# Patient Record
Sex: Female | Born: 1940 | Race: Black or African American | Hispanic: No | Marital: Single | Smoking: Former smoker
Health system: Southern US, Community
[De-identification: ages and names within clinical notes are randomized; demographics above are authoritative.]

## PROBLEM LIST (undated history)

## (undated) DIAGNOSIS — C801 Malignant (primary) neoplasm, unspecified: Secondary | ICD-10-CM

## (undated) DIAGNOSIS — C50919 Malignant neoplasm of unspecified site of unspecified female breast: Secondary | ICD-10-CM

## (undated) HISTORY — PX: ABDOMINAL HYSTERECTOMY: SHX81

## (undated) HISTORY — PX: EYE SURGERY: SHX253

---

## 2021-03-28 ENCOUNTER — Emergency Department (HOSPITAL_COMMUNITY): Payer: Medicare Other

## 2021-03-28 ENCOUNTER — Encounter (HOSPITAL_COMMUNITY): Payer: Self-pay

## 2021-03-28 ENCOUNTER — Emergency Department (HOSPITAL_COMMUNITY)
Admission: EM | Admit: 2021-03-28 | Discharge: 2021-03-28 | Disposition: A | Discharge status: Home / Self Care | Payer: Medicare Other | Attending: Emergency Medicine | Admitting: Emergency Medicine

## 2021-03-28 ENCOUNTER — Other Ambulatory Visit: Payer: Self-pay

## 2021-03-28 DIAGNOSIS — J984 Other disorders of lung: Secondary | ICD-10-CM | POA: Insufficient documentation

## 2021-03-28 DIAGNOSIS — Z853 Personal history of malignant neoplasm of breast: Secondary | ICD-10-CM | POA: Diagnosis not present

## 2021-03-28 DIAGNOSIS — R55 Syncope and collapse: Secondary | ICD-10-CM | POA: Diagnosis not present

## 2021-03-28 DIAGNOSIS — Z87891 Personal history of nicotine dependence: Secondary | ICD-10-CM | POA: Insufficient documentation

## 2021-03-28 DIAGNOSIS — Y9301 Activity, walking, marching and hiking: Secondary | ICD-10-CM | POA: Insufficient documentation

## 2021-03-28 DIAGNOSIS — S62307A Unspecified fracture of fifth metacarpal bone, left hand, initial encounter for closed fracture: Secondary | ICD-10-CM | POA: Diagnosis not present

## 2021-03-28 DIAGNOSIS — W01198A Fall on same level from slipping, tripping and stumbling with subsequent striking against other object, initial encounter: Secondary | ICD-10-CM | POA: Insufficient documentation

## 2021-03-28 DIAGNOSIS — S0081XA Abrasion of other part of head, initial encounter: Secondary | ICD-10-CM | POA: Insufficient documentation

## 2021-03-28 DIAGNOSIS — Y9289 Other specified places as the place of occurrence of the external cause: Secondary | ICD-10-CM | POA: Insufficient documentation

## 2021-03-28 DIAGNOSIS — E876 Hypokalemia: Secondary | ICD-10-CM | POA: Diagnosis not present

## 2021-03-28 DIAGNOSIS — S60922A Unspecified superficial injury of left hand, initial encounter: Secondary | ICD-10-CM | POA: Diagnosis present

## 2021-03-28 HISTORY — DX: Malignant (primary) neoplasm, unspecified: C80.1

## 2021-03-28 HISTORY — DX: Malignant neoplasm of unspecified site of unspecified female breast: C50.919

## 2021-03-28 LAB — CBC WITH DIFFERENTIAL/PLATELET
Abs Immature Granulocytes: 0.01 10*3/uL (ref 0.00–0.07)
Basophils Absolute: 0 10*3/uL (ref 0.0–0.1)
Basophils Relative: 1 %
Eosinophils Absolute: 0 10*3/uL (ref 0.0–0.5)
Eosinophils Relative: 1 %
HCT: 37.8 % (ref 36.0–46.0)
Hemoglobin: 12.3 g/dL (ref 12.0–15.0)
Immature Granulocytes: 0 %
Lymphocytes Relative: 19 %
Lymphs Abs: 1 10*3/uL (ref 0.7–4.0)
MCH: 28.6 pg (ref 26.0–34.0)
MCHC: 32.5 g/dL (ref 30.0–36.0)
MCV: 87.9 fL (ref 80.0–100.0)
Monocytes Absolute: 0.4 10*3/uL (ref 0.1–1.0)
Monocytes Relative: 7 %
Neutro Abs: 3.7 10*3/uL (ref 1.7–7.7)
Neutrophils Relative %: 72 %
Platelets: 295 10*3/uL (ref 150–400)
RBC: 4.3 MIL/uL (ref 3.87–5.11)
RDW: 13.6 % (ref 11.5–15.5)
WBC: 5.1 10*3/uL (ref 4.0–10.5)
nRBC: 0 % (ref 0.0–0.2)

## 2021-03-28 LAB — COMPREHENSIVE METABOLIC PANEL
ALT: 12 U/L (ref 0–44)
AST: 17 U/L (ref 15–41)
Albumin: 4.1 g/dL (ref 3.5–5.0)
Alkaline Phosphatase: 71 U/L (ref 38–126)
Anion gap: 7 (ref 5–15)
BUN: 12 mg/dL (ref 8–23)
CO2: 25 mmol/L (ref 22–32)
Calcium: 9.5 mg/dL (ref 8.9–10.3)
Chloride: 109 mmol/L (ref 98–111)
Creatinine, Ser: 0.74 mg/dL (ref 0.44–1.00)
GFR, Estimated: >60 mL/min (ref >60)
Glucose, Bld: 95 mg/dL (ref 70–99)
Potassium: 3.4 mmol/L — ABNORMAL LOW (ref 3.5–5.1)
Sodium: 141 mmol/L (ref 135–145)
Total Bilirubin: 0.6 mg/dL (ref 0.3–1.2)
Total Protein: 6.9 g/dL (ref 6.5–8.1)

## 2021-03-28 LAB — LIPASE, BLOOD: Lipase: 28 U/L (ref 11–51)

## 2021-03-28 MED ORDER — FENTANYL CITRATE (PF) 100 MCG/2ML IJ SOLN
50.0000 ug | Freq: Once | INTRAMUSCULAR | Status: AC
Start: 2021-03-28 — End: 2021-03-28
  Administered 2021-03-28: 50 ug via INTRAVENOUS
  Filled 2021-03-28: qty 2

## 2021-03-28 MED ORDER — OXYCODONE-ACETAMINOPHEN 5-325 MG PO TABS: 2.0000 | ORAL_TABLET | ORAL | 0 refills | Status: AC | PRN

## 2021-03-28 MED ORDER — LACTATED RINGERS IV BOLUS
500.0000 mL | Freq: Once | INTRAVENOUS | Status: AC
  Administered 2021-03-28: 500 mL via INTRAVENOUS

## 2021-03-28 MED ORDER — POTASSIUM CHLORIDE CRYS ER 20 MEQ PO TBCR
80.0000 meq | EXTENDED_RELEASE_TABLET | Freq: Once | ORAL | Status: AC
  Administered 2021-03-28: 80 meq via ORAL
  Filled 2021-03-28: qty 4

## 2021-03-28 MED ORDER — ACETAMINOPHEN 500 MG PO TABS
1000.0000 mg | ORAL_TABLET | Freq: Once | ORAL | Status: AC
  Administered 2021-03-28: 1000 mg via ORAL
  Filled 2021-03-28: qty 2

## 2021-03-28 MED ORDER — OXYCODONE-ACETAMINOPHEN 5-325 MG PO TABS
1.0000 | ORAL_TABLET | Freq: Once | ORAL | Status: AC
  Administered 2021-03-28: 1 via ORAL
  Filled 2021-03-28: qty 1

## 2021-03-28 NOTE — ED Notes (Signed)
Patient transported to X-ray 

## 2021-03-28 NOTE — ED Triage Notes (Signed)
Pt arrived via [facility] from [facility]. Pt states she felt light headed while sitting in bleachers and walked down and was leaning on a light pole and had a syncopal episode. Per EMS bystanders stated pt had a ground level syncopal episode and fell forward hitting her head on cement. Pt denies blood thinners. Pt c/o 7/10 throbbing head pain and 9/10 sharp pain in left 4th and 5th digits and it radiates to left wrist. Pt denies neck or back pain. Pt able to move all extremities. Pt is A&Ox4. Pt denies vision changes and dizziness.

## 2021-03-28 NOTE — ED Provider Notes (Signed)
[facility] Provider Note   CSN: [id number] Arrival date & time: 03/28/21  1428     History Chief Complaint  Patient presents with  . Loss of Consciousness    [facility] Olson is a 80 y.o. female.  80 year old female who presents to the emergency department today for syncope.  Patient states that she had been sitting outside for Olson a while at a track meet.  She got Olson hot and felt a little bit lightheaded and nauseous.  She went to walk down the steps and subsequently syncopized near a pole.  Bystanders state that they saw her fall forward and hit her head.  EMS was called.  Reportedly blood sugar was normal.  Patient does have an abrasion to the left side of her forehead and severe left hand pain.  She denies any chest pain, palpitations, shortness of breath, back pain, abdominal pain or focal weakness prior to the syncopal episode.  She states that she syncopized another time a few weeks ago but does not remember those specific details.  She has a distant history of breast cancer but currently only takes hyperlipidemia medication.  No recent illnesses. No other associated factors.         Past Medical History:  Diagnosis Date  . Breast cancer ([place])   . Cancer ([place])     There are no problems to display for this patient.   Past Surgical History:  Procedure Laterality Date  . ABDOMINAL HYSTERECTOMY    . EYE SURGERY       OB History   No obstetric history on file.     No family history on file.  Social History   Tobacco Use  . Smoking status: Former Research scientist (life sciences)  . Smokeless tobacco: Never Used  Substance Use Topics  . Alcohol use: Yes    Comment: occ  . Drug use: Never    Home Medications Prior to Admission medications   Medication Sig Start Date End Date Taking? Authorizing Provider  oxyCODONE-acetaminophen (PERCOCET) 5-325 MG tablet Take 2 tablets by mouth every 4 (four) hours as needed. 03/28/21  Yes Rexanna Louthan, Corene Cornea, MD     Allergies    Patient has no known allergies.  Review of Systems   Review of Systems  All other systems reviewed and are negative.   Physical Exam Updated Vital Signs BP (!) 117/95 (BP Location: Left Arm)   Pulse 97   Temp 98.1 F (36.7 C) (Oral)   Resp 16   Ht 4\' 11"  (1.499 m)   Wt 49 kg   SpO2 96%   BMI 21.81 kg/m   Physical Exam Vitals and nursing note reviewed.  Constitutional:      Appearance: She is well-developed.  HENT:     Head: Normocephalic and atraumatic.     Mouth/Throat:     Mouth: Mucous membranes are moist.     Pharynx: Oropharynx is clear.  Eyes:     Comments: Right eye prosthesis however exam seems consistent with what I'd expect her baseline to be.   Cardiovascular:     Rate and Rhythm: Normal rate and regular rhythm.  Pulmonary:     Effort: No respiratory distress.     Breath sounds: No stridor.  Abdominal:     General: Abdomen is flat. There is no distension.  Musculoskeletal:        General: No swelling or tenderness. Normal range of motion.     Cervical back: Normal range of motion.  Comments: No cervical spine tenderness, thoracic spine tenderness or Lumbar spine tenderness.  No tenderness or pain with palpation and full ROM of all joints in Right upper and Bilateral lower extremities. Does have pain with palpation and ROM of 4/5 metacarpals on left with mild ecchymosis and swelling No ecchymosis or other signs of trauma on back or extremities.  No Pain with AP or lateral compression of ribs.  No Paracervical ttp, paraspinal ttp   Skin:    General: Skin is warm and dry.  Neurological:     Mental Status: She is alert and oriented to person, place, and time. Mental status is at baseline.     Sensory: No sensory deficit.     Motor: No weakness.     Coordination: Coordination normal.     Gait: Gait normal.     ED Results / Procedures / Treatments   Labs (all labs ordered are listed, but only abnormal results are displayed) Labs  Reviewed  COMPREHENSIVE METABOLIC PANEL - Abnormal; Notable for the following components:      Result Value   Potassium 3.4 (*)    All other components within normal limits  CBC WITH DIFFERENTIAL/PLATELET  LIPASE, BLOOD    EKG EKG Interpretation  Date/Time:  Friday Mar 28 2021 15:21:06 EDT Ventricular Rate:  71 PR Interval:  163 QRS Duration: 81 QT Interval:  403 QTC Calculation: 438 R Axis:   50 Text Interpretation: Sinus rhythm Anteroseptal infarct, old Confirmed by Merrily Pew [phone]) on 03/28/2021 4:16:04 PM   Radiology DG Chest 2 View  Result Date: 03/28/2021 CLINICAL DATA:  80 year old female with history of syncope. EXAM: CHEST - 2 VIEW COMPARISON:  No priors. FINDINGS: Lung volumes are normal. No consolidative airspace disease. No pleural effusions. Focal architectural distortion in the right upper lobe. No pneumothorax. No evidence of pulmonary edema. Heart size is normal. Aortic atherosclerosis. Surgical clips in the right axillary region from prior lymph node dissection. IMPRESSION: 1. Focal architectural distortion in the right upper lobe. This may simply represent an area of post infectious scarring, however, no prior studies are available for comparison. Repeat standing PA and lateral chest radiograph is recommended in 1-2 months to re-evaluate this area and ensure stability. Alternatively, if there is clinical concern for neoplasm, further evaluation with noncontrast chest CT could be performed. Electronically Signed   By: Vinnie Langton M.D.   On: 03/28/2021 15:21   CT Head Wo Contrast  Result Date: 03/28/2021 CLINICAL DATA:  Status post fall. EXAM: CT HEAD WITHOUT CONTRAST TECHNIQUE: Contiguous axial images were obtained from the base of the skull through the vertex without intravenous contrast. COMPARISON:  None. FINDINGS: Brain: There is mild cerebral atrophy with widening of the extra-axial spaces and ventricular dilatation. There are areas of decreased attenuation  within the white matter tracts of the supratentorial brain, consistent with microvascular disease changes. Vascular: No hyperdense vessel or unexpected calcification. Skull: Normal. Negative for fracture or focal lesion. Sinuses/Orbits: Postoperative changes are seen involving the right globe. Other: None. IMPRESSION: 1. Generalized cerebral atrophy. 2. No acute intracranial abnormality. Electronically Signed   By: Virgina Norfolk M.D.   On: 03/28/2021 16:04   DG Hand Complete Left  Result Date: 03/28/2021 CLINICAL DATA:  Status post fall. EXAM: LEFT HAND - COMPLETE 3+ VIEW COMPARISON:  None. FINDINGS: An acute fracture is seen involving the distal aspect of the fifth left metacarpal. There is no evidence of dislocation. Soft tissue swelling is seen adjacent to the previously noted fracture site.  IMPRESSION: Acute fracture of the fifth left metacarpal. Electronically Signed   By: Virgina Norfolk M.D.   On: 03/28/2021 16:04    Procedures Procedures   Medications Ordered in ED Medications  lactated ringers bolus 500 mL (0 mLs Intravenous Stopped 03/28/21 1701)  fentaNYL (SUBLIMAZE) injection 50 mcg (50 mcg Intravenous Given 03/28/21 1559)  acetaminophen (TYLENOL) tablet 1,000 mg (1,000 mg Oral Given 03/28/21 1558)  potassium chloride SA (KLOR-CON) CR tablet 80 mEq (80 mEq Oral Given 03/28/21 1701)  oxyCODONE-acetaminophen (PERCOCET/ROXICET) 5-325 MG per tablet 1 tablet (1 tablet Oral Given 03/28/21 1712)    ED Course  I have reviewed the triage vital signs and the nursing notes.  Pertinent labs & imaging results that were available during my care of the patient were reviewed by me and considered in my medical decision making (see chart for details).    MDM Rules/Calculators/A&P                          Here with LOC.  Patient without any preceding symptoms to suggest emergent etiology.  She feels like she just did not eat and drink enough and probably passed out because of the heat.  I  considered pulmonary embolus with her history of cancer but she is not hypoxic, tachycardia or have any symptoms of blood clot.  She does not have any evidence of a DVT I therefore think this is unlikely.  Ultimately found to have fractured 5th metacarpal. Spling/sling applied. No other significant abnormality aside from mildly decreased K, however no ECG changes that are worrisome to suggest arrhythmia. Was found to have some abnormal scarring in RUL, will fu w/ PCP for repeat imaging as indicated. Hand follow up for fracture. Pain meds provided.   Final Clinical Impression(s) / ED Diagnoses Final diagnoses:  Syncope and collapse  Hypokalemia  Scarring of lung  Closed nondisplaced fracture of fifth metacarpal bone of left hand, unspecified portion of metacarpal, initial encounter    Rx / DC Orders ED Discharge Orders         Ordered    oxyCODONE-acetaminophen (PERCOCET) 5-325 MG tablet  Every 4 hours PRN        03/28/21 1745           Asier Desroches, Corene Cornea, MD 03/28/21 1753

## 2021-03-28 NOTE — Progress Notes (Signed)
Orthopedic Tech Progress Note Patient Details:  [facility] Olson Aug 02, 1941 [id number]   Ortho Devices Type of Ortho Device: Ulna gutter splint,Shoulder immobilizer Ortho Device/Splint Location: LUE Ortho Device/Splint Interventions: Application,Adjustment   Post Interventions Patient Tolerated: Well Instructions Provided: Care of device,Adjustment of device   [facility] [name] 03/28/2021, 5:48 PM

## 2021-03-28 NOTE — ED Notes (Signed)
Patient transported to CT 

## 2021-03-28 NOTE — ED Notes (Signed)
Per EMS pt's nephew told them pt had a syncopal episode 2 wks ago also.

## 2021-03-28 NOTE — ED Notes (Signed)
Pt has family member picking her up

## 2021-03-28 NOTE — Discharge Instructions (Signed)
Chest Xray Result: 1. Focal architectural distortion in the right upper lobe. This may simply represent an area of post infectious scarring, however, no prior studies are available for comparison. Repeat standing PA and lateral chest radiograph is recommended in 1-2 months to re-evaluate this area and ensure stability. Alternatively, if there is clinical concern for neoplasm, further evaluation with noncontrast chest CT could be performed.

## 2021-03-29 ENCOUNTER — Telehealth: Payer: Self-pay

## 2021-03-29 NOTE — Telephone Encounter (Signed)
[facility] called regarding narcotic prescription they have limits on what can be given for acute injury changed to 1 tablet every 4 hours decreased total tablets to fit into parameters allowed at pharmacist recomm. Patient will need follow up at PCP for further needsendations.

## 2022-01-22 IMAGING — CT CT HEAD W/O CM
4 series · 16 of 47 positions shown, 18 images · non-contrast
Comparison: None.

CLINICAL DATA: Status post fall.

EXAM:
CT HEAD WITHOUT CONTRAST
TECHNIQUE: Contiguous axial images were obtained from the base of the skull
through the vertex without intravenous contrast.

[Series 3: head without · axial · non-contrast · 0.40mm/px · z∈[-92,+18]mm · 7 of 30 slices shown, 9 images]
[im 4/30  brain]
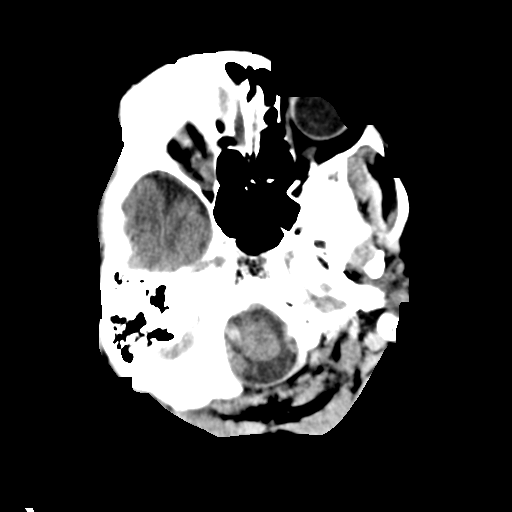
[im 4/30  bone]
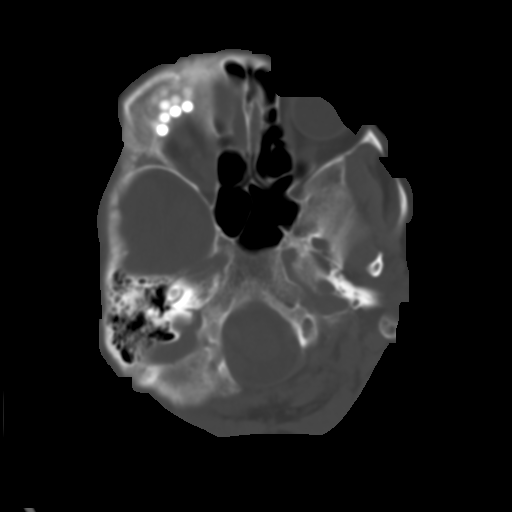
[im 8/30  brain]
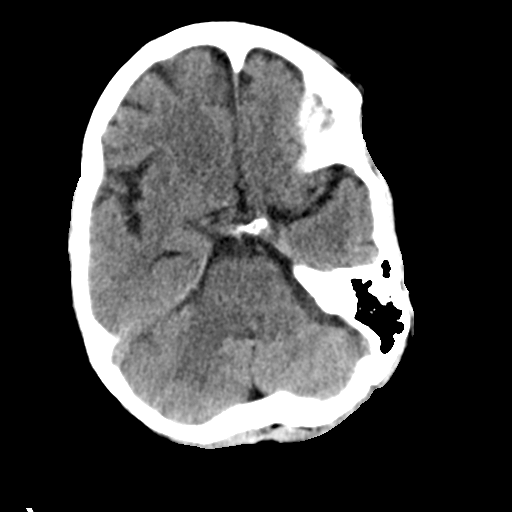
[im 11/30  brain]
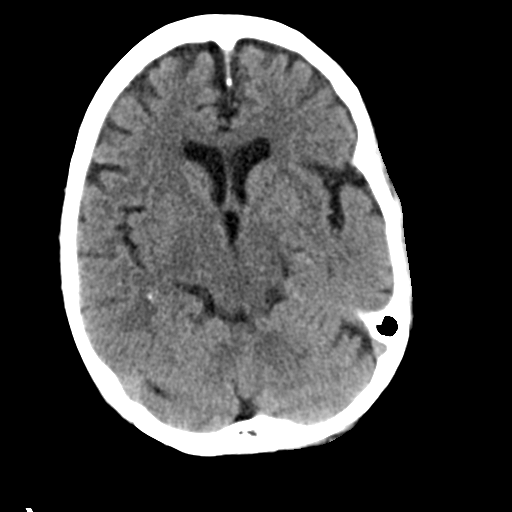
[im 15/30  brain]
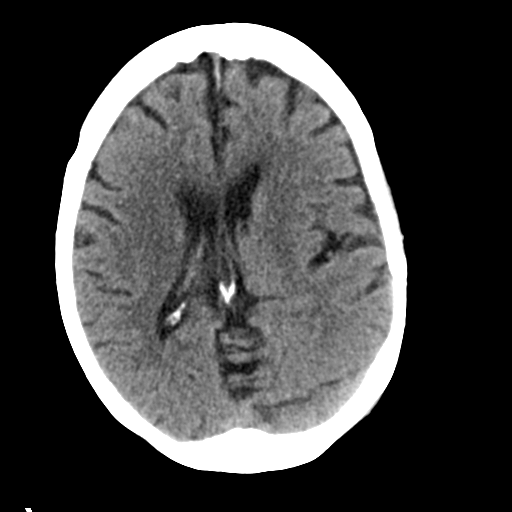
[im 19/30  brain]
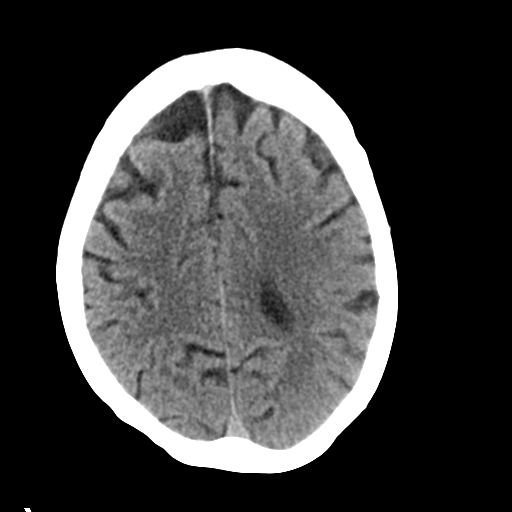
[im 19/30  bone]
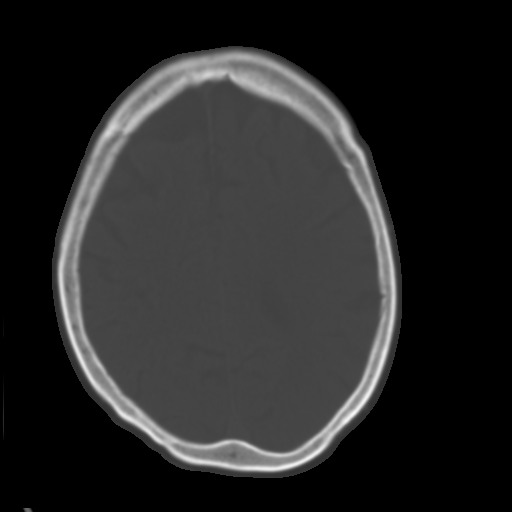
[im 22/30  brain]
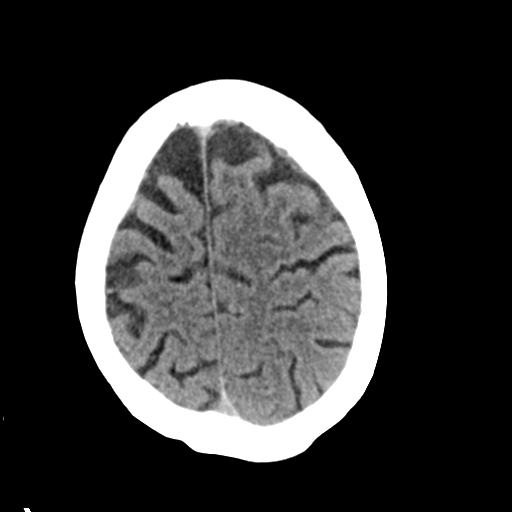
[im 26/30  brain]
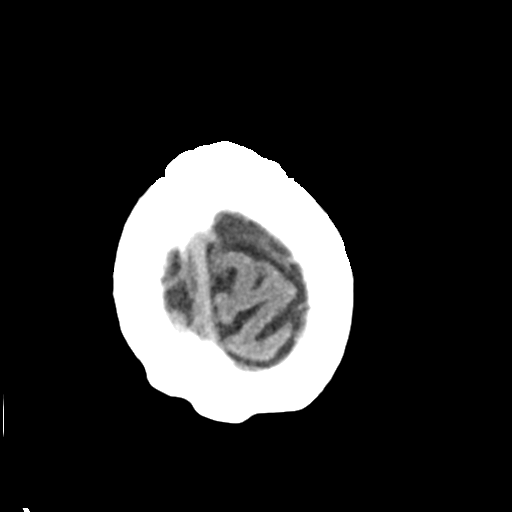

[Series 4: head bone · axial · 0.40mm/px · z∈[-94,-64]mm · 3 of 75 slices shown]
[im 8/75  bone]
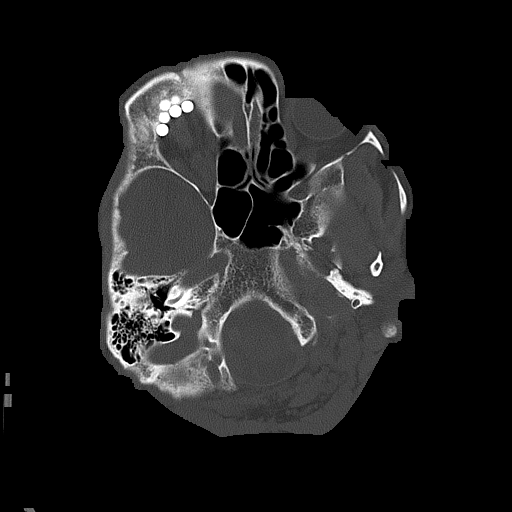
[im 15/75  bone]
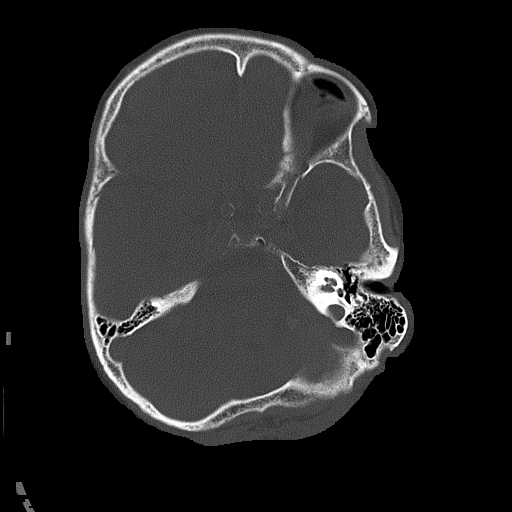
[im 23/75  bone]
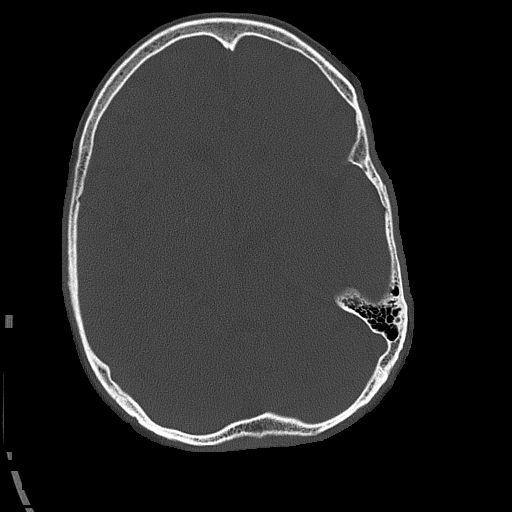

[Series 5: head without cor · coronal · non-contrast · 0.29mm/px · 3 of 67 slices shown]
[im 23/67  brain]
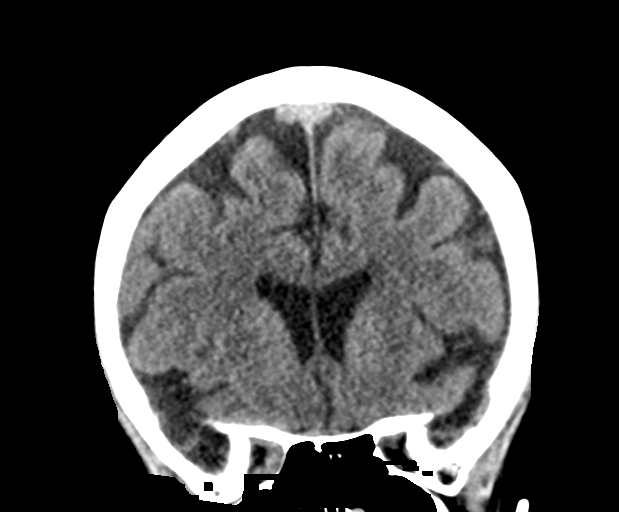
[im 30/67  brain]
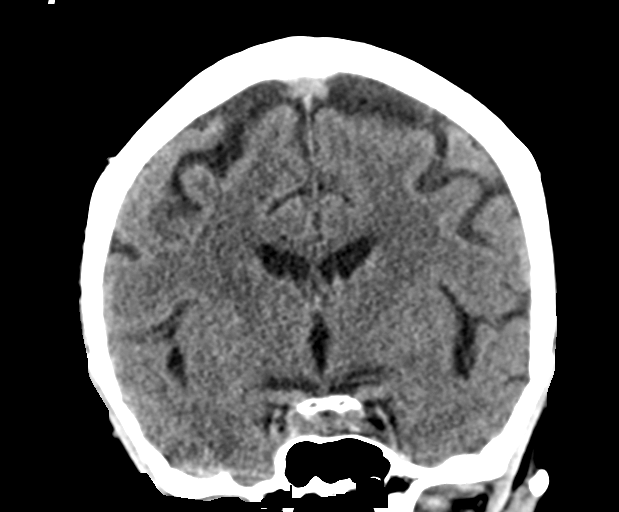
[im 37/67  brain]
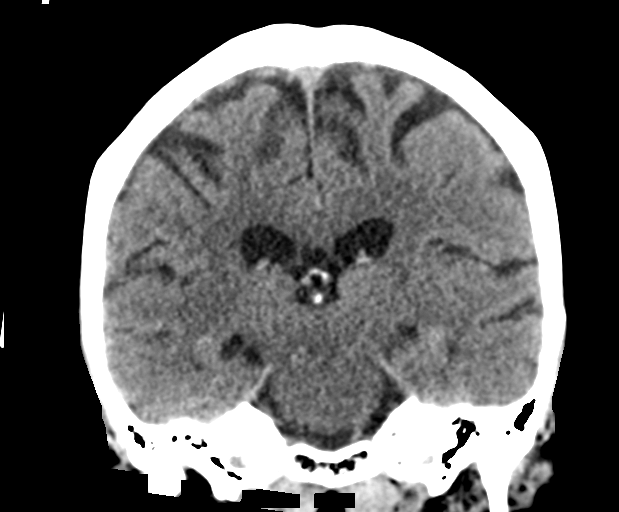

[Series 6: head without sag · sagittal · non-contrast · 0.29mm/px · 3 of 59 slices shown]
[im 23/59  brain]
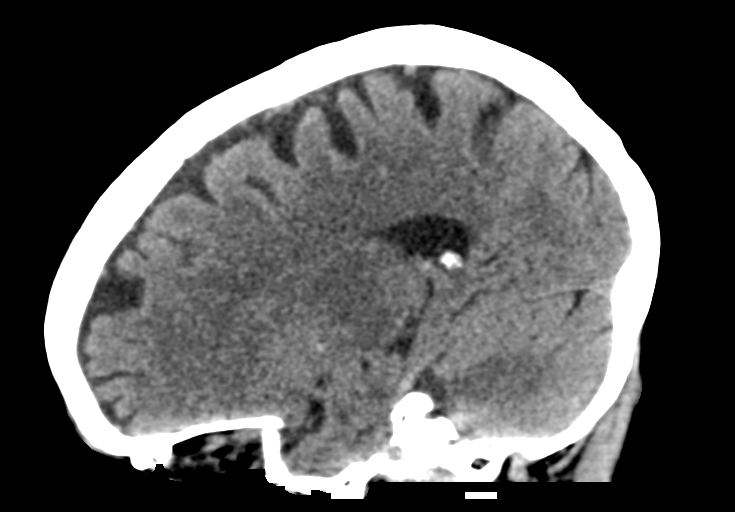
[im 30/59  brain]
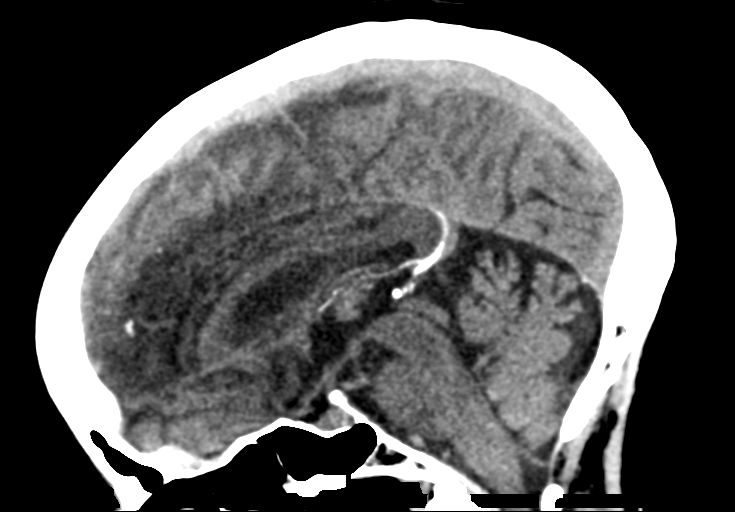
[im 36/59  brain]
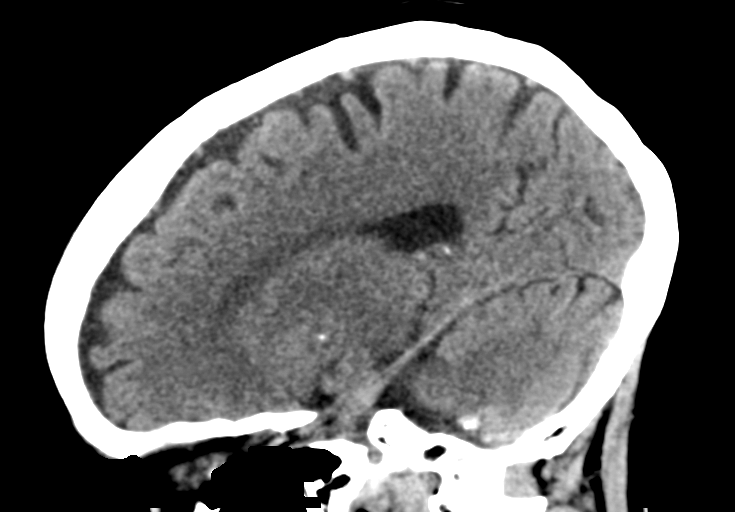

[16 of 47 positions shown; findings below may reference images not displayed]

FINDINGS: Brain: There is mild cerebral atrophy with widening of the
extra-axial spaces and ventricular dilatation.
There are areas of decreased attenuation within the white matter
tracts of the supratentorial brain, consistent with microvascular
disease changes.

Vascular: No hyperdense vessel or unexpected calcification.

Skull: Normal. Negative for fracture or focal lesion.

Sinuses/Orbits: Postoperative changes are seen involving the right
globe.

Other: None.
IMPRESSION: 1. Generalized cerebral atrophy.
2. No acute intracranial abnormality.
